# Patient Record
Sex: Female | Born: 1970 | Race: White | Hispanic: No | Marital: Married | Smoking: Never smoker
Health system: Southern US, Community
[De-identification: ages and names within clinical notes are randomized; demographics above are authoritative.]

---

## 2020-07-31 ENCOUNTER — Emergency Department (INDEPENDENT_AMBULATORY_CARE_PROVIDER_SITE_OTHER)
Admission: EM | Admit: 2020-07-31 | Discharge: 2020-07-31 | Disposition: A | Discharge status: Home / Self Care | Payer: 59 | Source: Home / Self Care

## 2020-07-31 ENCOUNTER — Emergency Department (INDEPENDENT_AMBULATORY_CARE_PROVIDER_SITE_OTHER): Payer: 59

## 2020-07-31 ENCOUNTER — Other Ambulatory Visit: Payer: Self-pay

## 2020-07-31 ENCOUNTER — Encounter: Payer: Self-pay | Admitting: Emergency Medicine

## 2020-07-31 ENCOUNTER — Emergency Department: Admit: 2020-07-31 | Discharge status: Absent without leave | Payer: Self-pay

## 2020-07-31 DIAGNOSIS — M25552 Pain in left hip: Secondary | ICD-10-CM | POA: Diagnosis not present

## 2020-07-31 DIAGNOSIS — M25559 Pain in unspecified hip: Secondary | ICD-10-CM | POA: Diagnosis not present

## 2020-07-31 MED ORDER — PREDNISONE 20 MG PO TABS: 40.0000 mg | ORAL_TABLET | Freq: Every day | ORAL | 0 refills | Status: DC

## 2020-07-31 MED ORDER — TIZANIDINE HCL 4 MG PO TABS: 4.0000 mg | ORAL_TABLET | Freq: Every day | ORAL | 0 refills | Status: DC

## 2020-07-31 NOTE — ED Triage Notes (Signed)
Lt hip pain from groin to hip, denies injury x 4 months. Unvaccinated

## 2020-07-31 NOTE — ED Provider Notes (Signed)
[facility]    CSN: [id number] Arrival date & time: 07/31/20  [id number]      History   Chief Complaint Chief Complaint  Patient presents with  . Hip Pain    HPI Jessica Mann is a 50 y.o. female.   HPI Patient in today for evaluation of left hip pain x 4 months. No injury. Pain radiates into the groin region. Attempted relief OTC analgesics without relief. 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 She feels pain is worsening. History reviewed. No pertinent past medical history.  There are no problems to display for this patient.   History reviewed. No pertinent surgical history.  OB History   No obstetric history on file.      Home Medications    Prior to Admission medications   Not on File    Family History Family History  Problem Relation Age of Onset  . Lupus Mother   . Cancer Father     Social History Social History   Tobacco Use  . Smoking status: Never Smoker  . Smokeless tobacco: Never Used  Vaping Use  . Vaping Use: Never used  Substance Use Topics  . Alcohol use: Yes     Allergies   Patient has no known allergies.   Review of Systems Review of Systems Pertinent negatives listed in HPI  Physical Exam Triage Vital Signs ED Triage Vitals  Enc Vitals Group     BP 07/31/20 1032 134/90     Pulse Rate 07/31/20 1032 88     Resp 07/31/20 1032 18     Temp 07/31/20 1032 97.9 F (36.6 C)     Temp Source 07/31/20 1032 Oral     SpO2 07/31/20 1032 95 %     Weight 07/31/20 1034 195 lb (88.5 kg)     Height 07/31/20 1034 5\' 2"  (1.575 m)     Head Circumference --      Peak Flow --      Pain Score 07/31/20 1033 8     Pain Loc --      Pain Edu? --      Excl. in [place]? --    No data found.  Updated Vital Signs BP 134/90 (BP Location: Right Arm)   Pulse 88   Temp 97.9 F (36.6 C) (Oral)   Resp 18   Ht 5\' 2"  (1.575 m)   Wt 195 lb (88.5 kg)   LMP 07/19/2020   SpO2 95%   BMI 35.67  kg/m   Visual Acuity Right Eye Distance:   Left Eye Distance:   Bilateral Distance:    Right Eye Near:   Left Eye Near:    Bilateral Near:     Physical Exam   UC Treatments / Results  Labs (all labs ordered are listed, but only abnormal results are displayed) Labs Reviewed - No data to display  EKG   Radiology DG HIP UNILAT WITH PELVIS 2-3 VIEWS LEFT  Result Date: 07/31/2020 CLINICAL DATA:  Four months of hip pain EXAM: DG HIP (WITH OR WITHOUT PELVIS) 2-3V LEFT COMPARISON:  None. FINDINGS: There is no evidence of hip fracture or dislocation. There is no evidence of arthropathy or other focal bone abnormality. Probable phleboliths within the pelvis. IMPRESSION: No fracture or dislocation of the left hip. The joint spaces are well preserved. Electronically Signed   By: Eddie Candle M.D.   On: 07/31/2020 11:18   Procedures Procedures (including critical  care time)  Medications Ordered in UC Medications - No data to display  Initial Impression / Assessment and Plan / UC Course  I have reviewed the triage vital signs and the nursing notes.  Pertinent labs & imaging results that were available during my care of the patient were reviewed by me and considered in my medical decision making (see chart for details).     Left hip pain, imaging negative of any acute or chronic changes which would explain symptoms. Trial short of prednisone and muscle relaxer for at bedtime for pain. Sports medicine follow-up if symptoms worsen or do no improve  Final Clinical Impressions(s) / UC Diagnoses   Final diagnoses:  Hip pain     Discharge Instructions     Follow-up with sports medicine provider for ongoing work-up and management of hip pain.  Continue application of heat. You also can take extra strength tylenol with prednisone for pain relief and hold Tizanidine for bedtime hip pain relief as medication can cause drowsiness.    ED Prescriptions    None     PDMP not reviewed  this encounter.   Bing Neighbors, FNP 08/06/20 [phone]

## 2020-07-31 NOTE — Discharge Instructions (Addendum)
Follow-up with sports medicine provider for ongoing work-up and management of hip pain.  Continue application of heat. You also can take extra strength tylenol with prednisone for pain relief and hold Tizanidine for bedtime hip pain relief as medication can cause drowsiness.

## 2020-10-31 ENCOUNTER — Emergency Department (HOSPITAL_BASED_OUTPATIENT_CLINIC_OR_DEPARTMENT_OTHER)
Admission: RE | Admit: 2020-10-31 | Discharge: 2020-10-31 | Disposition: A | Discharge status: Home / Self Care | Payer: 59 | Source: Ambulatory Visit | Attending: Emergency Medicine | Admitting: Emergency Medicine

## 2020-10-31 ENCOUNTER — Other Ambulatory Visit: Payer: Self-pay

## 2020-10-31 ENCOUNTER — Emergency Department (HOSPITAL_BASED_OUTPATIENT_CLINIC_OR_DEPARTMENT_OTHER)
Admission: EM | Admit: 2020-10-31 | Discharge: 2020-10-31 | Disposition: A | Discharge status: Home / Self Care | Payer: 59 | Attending: Emergency Medicine | Admitting: Emergency Medicine

## 2020-10-31 ENCOUNTER — Encounter (HOSPITAL_BASED_OUTPATIENT_CLINIC_OR_DEPARTMENT_OTHER): Payer: Self-pay | Admitting: Emergency Medicine

## 2020-10-31 DIAGNOSIS — R1011 Right upper quadrant pain: Secondary | ICD-10-CM | POA: Insufficient documentation

## 2020-10-31 DIAGNOSIS — R1013 Epigastric pain: Secondary | ICD-10-CM | POA: Diagnosis present

## 2020-10-31 DIAGNOSIS — K760 Fatty (change of) liver, not elsewhere classified: Secondary | ICD-10-CM | POA: Insufficient documentation

## 2020-10-31 DIAGNOSIS — R7401 Elevation of levels of liver transaminase levels: Secondary | ICD-10-CM

## 2020-10-31 DIAGNOSIS — R1010 Upper abdominal pain, unspecified: Secondary | ICD-10-CM

## 2020-10-31 LAB — TROPONIN I (HIGH SENSITIVITY): Troponin I (High Sensitivity): 3 ng/L (ref <18)

## 2020-10-31 LAB — URINALYSIS, ROUTINE W REFLEX MICROSCOPIC
Bilirubin Urine: NEGATIVE
Glucose, UA: NEGATIVE mg/dL
Hgb urine dipstick: NEGATIVE
Ketones, ur: NEGATIVE mg/dL
Nitrite: NEGATIVE
Protein, ur: NEGATIVE mg/dL
Specific Gravity, Urine: 1.02 (ref 1.005–1.030)
pH: 7 (ref 5.0–8.0)

## 2020-10-31 LAB — COMPREHENSIVE METABOLIC PANEL
ALT: 81 U/L — ABNORMAL HIGH (ref 0–44)
AST: 108 U/L — ABNORMAL HIGH (ref 15–41)
Albumin: 3.8 g/dL (ref 3.5–5.0)
Alkaline Phosphatase: 90 U/L (ref 38–126)
Anion gap: 10 (ref 5–15)
BUN: 15 mg/dL (ref 6–20)
CO2: 26 mmol/L (ref 22–32)
Calcium: 8.9 mg/dL (ref 8.9–10.3)
Chloride: 101 mmol/L (ref 98–111)
Creatinine, Ser: 0.63 mg/dL (ref 0.44–1.00)
GFR, Estimated: >60 mL/min (ref >60)
Glucose, Bld: 122 mg/dL — ABNORMAL HIGH (ref 70–99)
Potassium: 3.7 mmol/L (ref 3.5–5.1)
Sodium: 137 mmol/L (ref 135–145)
Total Bilirubin: 0.7 mg/dL (ref 0.3–1.2)
Total Protein: 6.8 g/dL (ref 6.5–8.1)

## 2020-10-31 LAB — CBC
HCT: 38.3 % (ref 36.0–46.0)
Hemoglobin: 13.1 g/dL (ref 12.0–15.0)
MCH: 28.6 pg (ref 26.0–34.0)
MCHC: 34.2 g/dL (ref 30.0–36.0)
MCV: 83.6 fL (ref 80.0–100.0)
Platelets: 171 10*3/uL (ref 150–400)
RBC: 4.58 MIL/uL (ref 3.87–5.11)
RDW: 12.6 % (ref 11.5–15.5)
WBC: 8.5 10*3/uL (ref 4.0–10.5)
nRBC: 0 % (ref 0.0–0.2)

## 2020-10-31 LAB — URINALYSIS, MICROSCOPIC (REFLEX): WBC, UA: >50 WBC/hpf (ref 0–5)

## 2020-10-31 LAB — LIPASE, BLOOD: Lipase: 48 U/L (ref 11–51)

## 2020-10-31 LAB — PREGNANCY, URINE: Preg Test, Ur: NEGATIVE

## 2020-10-31 MED ORDER — ONDANSETRON HCL 4 MG/2ML IJ SOLN
4.0000 mg | Freq: Once | INTRAMUSCULAR | Status: AC
  Administered 2020-10-31: 4 mg via INTRAVENOUS
  Filled 2020-10-31: qty 2

## 2020-10-31 MED ORDER — MORPHINE SULFATE (PF) 4 MG/ML IV SOLN
4.0000 mg | Freq: Once | INTRAVENOUS | Status: AC
  Administered 2020-10-31: 4 mg via INTRAVENOUS
  Filled 2020-10-31: qty 1

## 2020-10-31 MED ORDER — LACTATED RINGERS IV BOLUS
1000.0000 mL | Freq: Once | INTRAVENOUS | Status: AC
  Administered 2020-10-31: 1000 mL via INTRAVENOUS

## 2020-10-31 MED ORDER — PROCHLORPERAZINE EDISYLATE 10 MG/2ML IJ SOLN
10.0000 mg | Freq: Once | INTRAMUSCULAR | Status: AC
  Administered 2020-10-31: 10 mg via INTRAVENOUS
  Filled 2020-10-31: qty 2

## 2020-10-31 NOTE — ED Provider Notes (Signed)
[facility]PART[facility] Provider Note   CSN: [id number] Arrival date & time: 10/31/20  0008     History Chief complaint: Abdominal pain  Jessica Mann is a 50 y.o. female.  The history is provided by the patient.  She has no significant past history and comes in having been awakened by epigastric pain which radiated around to the back and into the chest.  She describes a burning pain.  There is associated nausea and vomiting.  Pain was initially rated at 8/7, and has subsided to 6/7.  Nothing seems to make it better, nothing makes it worse.  It did not improve after emesis.  She denies constipation or diarrhea.  She had eaten a dinner of spaghetti and meatballs before going to bed.  History reviewed. No pertinent past medical history.  There are no problems to display for this patient.   History reviewed. No pertinent surgical history.   OB History   No obstetric history on file.     Family History  Problem Relation Age of Onset  . Lupus Mother   . Cancer Father     Social History   Tobacco Use  . Smoking status: Never Smoker  . Smokeless tobacco: Never Used  Vaping Use  . Vaping Use: Never used  Substance Use Topics  . Alcohol use: Yes  . Drug use: Never    Home Medications Prior to Admission medications   Medication Sig Start Date End Date Taking? Authorizing Provider  predniSONE (DELTASONE) 20 MG tablet Take 2 tablets (40 mg total) by mouth daily with breakfast. 07/31/20   Scot Jun, FNP  tiZANidine (ZANAFLEX) 4 MG tablet Take 1 tablet (4 mg total) by mouth at bedtime. 07/31/20   Scot Jun, FNP    Allergies    Patient has no known allergies.  Review of Systems   Review of Systems  All other systems reviewed and are negative.   Physical Exam Updated Vital Signs BP (!) 166/94 (BP Location: Right Arm)   Pulse 77   Temp 97.7 F (36.5 C) (Oral)   Resp 16   Ht 5\' 2"  (1.575 m)   Wt 86 kg   LMP 10/20/2020   SpO2 100%    BMI 34.70 kg/m   Physical Exam Vitals and nursing note reviewed.   50 year old female, resting comfortably and in no acute distress. Vital signs are significant for elevated blood pressure. Oxygen saturation is 100%, which is normal. Head is normocephalic and atraumatic. PERRLA, EOMI. Oropharynx is clear. Neck is nontender and supple without adenopathy or JVD. Back is nontender and there is no CVA tenderness. Lungs are clear without rales, wheezes, or rhonchi. Chest is nontender. Heart has regular rate and rhythm without murmur. Abdomen is soft, flat, with moderate right upper quadrant tenderness with a positive Murphy sign.  No other abdominal tenderness elicited.  There are no masses or hepatosplenomegaly and peristalsis is hypoactive. Extremities have no cyanosis or edema, full range of motion is present. Skin is warm and dry without rash. Neurologic: Mental status is normal, cranial nerves are intact, there are no motor or sensory deficits.  ED Results / Procedures / Treatments   Labs (all labs ordered are listed, but only abnormal results are displayed) Labs Reviewed  COMPREHENSIVE METABOLIC PANEL - Abnormal; Notable for the following components:      Result Value   Glucose, Bld 122 (*)    AST 108 (*)    ALT 81 (*)  All other components within normal limits  URINALYSIS, ROUTINE W REFLEX MICROSCOPIC - Abnormal; Notable for the following components:   APPearance HAZY (*)    Leukocytes,Ua LARGE (*)    All other components within normal limits  URINALYSIS, MICROSCOPIC (REFLEX) - Abnormal; Notable for the following components:   Bacteria, UA MANY (*)    All other components within normal limits  LIPASE, BLOOD  CBC  PREGNANCY, URINE  TROPONIN I (HIGH SENSITIVITY)    EKG EKG Interpretation  Date/Time:  Wednesday October 31 2020 00:24:12 EDT Ventricular Rate:  71 PR Interval:  141 QRS Duration: 88 QT Interval:  398 QTC Calculation: 433 R Axis:   64 Text  Interpretation: Sinus rhythm Normal ECG No significant change was found Confirmed by Delora Fuel ([id number]) on 10/31/2020 12:30:55 AM  Procedures Procedures   Medications Ordered in ED Medications  lactated ringers bolus 1,000 mL (has no administration in time range)  ondansetron (ZOFRAN) injection 4 mg (has no administration in time range)  morphine 4 MG/ML injection 4 mg (has no administration in time range)    ED Course  I have reviewed the triage vital signs and the nursing notes.  Pertinent lab results that were available during my care of the patient were reviewed by me and considered in my medical decision making (see chart for details).  MDM Rules/Calculators/A&P Epigastric pain concerning for biliary colic.  Consider peptic ulcer disease, pancreatitis, cardiac disease, diverticulitis.  Differential does include conditions with significant morbidity and mortality.  Old records are reviewed, and she has no relevant past visits.  No prior abdominal imaging studies.  Screening labs are obtained and she will be given IV fluids, morphine, ondansetron.    She feels much better following above-noted treatment.  ECG is normal.  Labs do show mild elevation of transaminases which would be consistent with biliary tract disease.  Lipase is normal and WBC is normal.  Urinalysis has bacteria and pyuria, but patient has no symptoms of UTI and does not need to be on antibiotics.  She will be discharged with arrangements made for outpatient right upper quadrant ultrasound.  Final Clinical Impression(s) / ED Diagnoses Final diagnoses:  Upper abdominal pain  Elevated transaminase level    Rx / DC Orders ED Discharge Orders         Ordered    US Abdomen Limited RUQ/Gall Bladder        10/31/20 8891           Delora Fuel, MD 69/45/03 0151

## 2020-10-31 NOTE — ED Triage Notes (Signed)
Pt state upper abdomen and chest pain "feels like heart burn" started 1 hour ago

## 2020-10-31 NOTE — Discharge Instructions (Addendum)
I am suspicious that gallstones are what caused your pain tonight.  Please return in the morning for an ultrasound to see if you do indeed have gallstones.  If gallstones are present, you will need to make an appointment with the general surgeon.  If gallstones are not present, you will need to follow-up with your primary care provider for further evaluation.

## 2020-11-15 ENCOUNTER — Other Ambulatory Visit: Payer: Self-pay

## 2020-11-15 ENCOUNTER — Encounter: Payer: Self-pay | Admitting: Family Medicine

## 2020-11-15 ENCOUNTER — Ambulatory Visit (INDEPENDENT_AMBULATORY_CARE_PROVIDER_SITE_OTHER): Payer: 59 | Admitting: Family Medicine

## 2020-11-15 VITALS — BP 122/80 | HR 86 | Temp 98.3°F | Ht 61.5 in | Wt 185.4 lb

## 2020-11-15 DIAGNOSIS — Z131 Encounter for screening for diabetes mellitus: Secondary | ICD-10-CM | POA: Diagnosis not present

## 2020-11-15 DIAGNOSIS — Z1159 Encounter for screening for other viral diseases: Secondary | ICD-10-CM

## 2020-11-15 DIAGNOSIS — Z0000 Encounter for general adult medical examination without abnormal findings: Secondary | ICD-10-CM | POA: Diagnosis not present

## 2020-11-15 DIAGNOSIS — R7989 Other specified abnormal findings of blood chemistry: Secondary | ICD-10-CM | POA: Diagnosis not present

## 2020-11-15 DIAGNOSIS — D367 Benign neoplasm of other specified sites: Secondary | ICD-10-CM

## 2020-11-15 DIAGNOSIS — Z1321 Encounter for screening for nutritional disorder: Secondary | ICD-10-CM

## 2020-11-15 DIAGNOSIS — Z114 Encounter for screening for human immunodeficiency virus [HIV]: Secondary | ICD-10-CM

## 2020-11-15 DIAGNOSIS — Z13220 Encounter for screening for lipoid disorders: Secondary | ICD-10-CM

## 2020-11-15 DIAGNOSIS — Z1211 Encounter for screening for malignant neoplasm of colon: Secondary | ICD-10-CM

## 2020-11-15 DIAGNOSIS — K760 Fatty (change of) liver, not elsewhere classified: Secondary | ICD-10-CM

## 2020-11-15 NOTE — Addendum Note (Signed)
Addended by: Lynnea Ferrier on: 11/15/2020 02:20 PM   Modules accepted: Orders

## 2020-11-15 NOTE — Progress Notes (Signed)
Jessica Mann is a 50 y.o. female  Chief Complaint  Patient presents with  . Establish Care    Np here for CPE, she is fasting today.Pt would a referral for a cyst on rt arm.  Pt is UTD w/pap, 06/2020, pt is not UTD w/colonoscopy, unsure od Tdap vaccine.    HPI: Jessica Mann is a 50 y.o. female seen today to establish care and for annual CPE, labs. She is fasting today.   Last PAP: 07/2020 - normal PAP, HPV negative. Waldon Reining, PA with [phone]. She has f/u appt for [place] and ablation. Last mammo: needs to schedule Last colonoscopy: due - last in 1998 (h/o parasitic infection) She is unsure of last Tdap but does not think it has been 10 years since last one.   Diet/Exercise: weight loss program [employer]) - lost 20lbs since 07/2020; no regular CV exercise but would like to encorporate Dental: UTD Vision: UTD  Pt requests referral to have cyst on Rt arm excised/removed. Present x 5 years, no change in size.   She was seen in ER 2 wks ago for upper abdominal pain. Normal UA, CBC, lipase, renal function. AST, ALT were elevated, normal bili. Abd US showed hepatic steatosis.    History reviewed. No pertinent past medical history.  History reviewed. No pertinent surgical history.  Social History   Socioeconomic History  . Marital status: Married    Spouse name: Not on file  . Number of children: Not on file  . Years of education: Not on file  . Highest education level: Not on file  Occupational History  . Not on file  Tobacco Use  . Smoking status: Never Smoker  . Smokeless tobacco: Never Used  Vaping Use  . Vaping Use: Never used  Substance and Sexual Activity  . Alcohol use: Yes  . Drug use: Never  . Sexual activity: Not on file  Other Topics Concern  . Not on file  Social History Narrative  . Not on file   Social Determinants of Health   Financial Resource Strain: Not on file  Food Insecurity: Not on file  Transportation Needs: Not on file  Physical Activity:  Not on file  Stress: Not on file  Social Connections: Not on file  Intimate Partner Violence: Not on file    Family History  Problem Relation Age of Onset  . Lupus Mother   . Cancer Father       There is no immunization history on file for this patient.  No outpatient encounter medications on file as of 11/15/2020.   No facility-administered encounter medications on file as of 11/15/2020.     ROS: Gen: no fever, chills  Skin: no rash, itching ENT: no ear pain, ear drainage, nasal congestion, rhinorrhea, sinus pressure, sore throat Eyes: no blurry vision, double vision Resp: no cough, wheeze,SOB CV: no CP, palpitations, LE edema,  GI: no heartburn, n/v/d/c, + upper abd pain GU: no dysuria, urgency, frequency, hematuria MSK: Lt hip pain Neuro: no dizziness, headache, weakness, vertigo Psych: no depression, anxiety, insomnia   No Known Allergies  BP 122/80 (BP Location: Right Arm, Patient Position: Sitting, Cuff Size: Normal)   Pulse 86   Temp 98.3 F (36.8 C) (Oral)   Ht 5' 1.5" (1.562 m)   Wt 185 lb 6.4 oz (84.1 kg)   LMP 11/15/2020   SpO2 97%   BMI 34.46 kg/m   Physical Exam Constitutional:      General: She is not in acute distress.  Appearance: She is well-developed.  HENT:     Head: Normocephalic and atraumatic.     Right Ear: Tympanic membrane and ear canal normal.     Left Ear: Tympanic membrane and ear canal normal.     Nose: Nose normal.     Mouth/Throat:     Mouth: Mucous membranes are moist.     Pharynx: Oropharynx is clear.  Eyes:     Conjunctiva/sclera: Conjunctivae normal.  Neck:     Thyroid: No thyromegaly.  Cardiovascular:     Rate and Rhythm: Normal rate and regular rhythm.     Heart sounds: Normal heart sounds. No murmur heard.   Pulmonary:     Effort: Pulmonary effort is normal. No respiratory distress.     Breath sounds: Normal breath sounds. No wheezing or rhonchi.  Abdominal:     General: Bowel sounds are normal. There is no  distension.     Palpations: Abdomen is soft. There is no mass.     Tenderness: There is no abdominal tenderness.  Musculoskeletal:     Cervical back: Neck supple.     Right lower leg: No edema.     Left lower leg: No edema.  Lymphadenopathy:     Cervical: No cervical adenopathy.  Skin:    General: Skin is warm and dry.  Neurological:     Mental Status: She is alert and oriented to person, place, and time.     Motor: No abnormal muscle tone.     Coordination: Coordination normal.  Psychiatric:        Behavior: Behavior normal.     CLINICAL DATA:  Right upper quadrant abdominal pain, nausea/vomiting  EXAM: ULTRASOUND ABDOMEN LIMITED RIGHT UPPER QUADRANT  FINDINGS: Gallbladder: No gallstones, gallbladder wall thickening, or pericholecystic fluid. Negative sonographic Murphy's sign.  Common bile duct: Diameter: 5 mm  Liver: Hepatic steatosis with focal fatty sparing, including along the gallbladder fossa. Portal vein is patent on color Doppler imaging with normal direction of blood flow towards the liver.  IMPRESSION: Hepatic steatosis with focal fatty sparing.   Lab Results  Component Value Date   WBC 8.5 10/31/2020   HGB 13.1 10/31/2020   HCT 38.3 10/31/2020   MCV 83.6 10/31/2020   PLT 171 10/31/2020   Lab Results  Component Value Date   NA 137 10/31/2020   K 3.7 10/31/2020   CREATININE 0.63 10/31/2020   GFRNONAA >60 10/31/2020   GLUCOSE 122 (H) 10/31/2020   Lab Results  Component Value Date   LIPASE 48 10/31/2020   Lab Results  Component Value Date   ALT 81 (H) 10/31/2020   AST 108 (H) 10/31/2020   ALKPHOS 90 10/31/2020   BILITOT 0.7 10/31/2020     A/P:  1. Annual physical exam - discussed importance of regular CV exercise, healthy diet, adequate sleep - UTD on PAP - pt to schedule mammo - colonoscopy referral placed today - UTD on dental and vision exams - labs today, other labs done about 2 wks ago (reviewed with pt today) - next  CPE in 1 year  2. Screening for diabetes mellitus (DM) - Glucose, fasting  3. Screening for lipid disorders - Lipid panel  4. Encounter for vitamin deficiency screening - VITAMIN D 25 Hydroxy (Vit-D Deficiency, Fractures)  5. Hepatic steatosis 6. Elevated LFTs - hepatic steatosis confirmed on [place] - mildly elevated LFTs - pt has lost 20lbs in 4 mo and continuing to work to lose weight and improve diet - recheck hepatic function panel  in 4-74mo  7. Dermoid cyst of arm, right - Ambulatory referral to Dermatology  8. Screening for colon cancer - Ambulatory referral to Gastroenterology  9. Encounter for hepatitis C screening test for low risk patient - Hepatitis C Antibody  10. Screening for HIV without presence of risk factors - HIV Antibody (routine testing w rflx)    This visit occurred during the SARS-CoV-2 public health emergency.  Safety protocols were in place, including screening questions prior to the visit, additional usage of staff PPE, and extensive cleaning of exam room while observing appropriate contact time as indicated for disinfecting solutions.

## 2020-11-16 LAB — LIPID PANEL
Cholesterol: 213 mg/dL — ABNORMAL HIGH (ref 0–200)
HDL: 34 mg/dL — ABNORMAL LOW (ref >39.00)
LDL Cholesterol: 142 mg/dL — ABNORMAL HIGH (ref 0–99)
NonHDL: 179.32
Total CHOL/HDL Ratio: 6
Triglycerides: 186 mg/dL — ABNORMAL HIGH (ref 0.0–149.0)
VLDL: 37.2 mg/dL (ref 0.0–40.0)

## 2020-11-16 LAB — HEPATITIS C ANTIBODY
Hepatitis C Ab: NONREACTIVE
SIGNAL TO CUT-OFF: 0.01 (ref <1.00)

## 2020-11-16 LAB — GLUCOSE, FASTING: Glucose, Bld: 91 mg/dL (ref 65–99)

## 2020-11-16 LAB — VITAMIN D 25 HYDROXY (VIT D DEFICIENCY, FRACTURES): VITD: 26.26 ng/mL — ABNORMAL LOW (ref 30.00–100.00)

## 2020-11-16 LAB — HIV ANTIBODY (ROUTINE TESTING W REFLEX): HIV 1&2 Ab, 4th Generation: NONREACTIVE

## 2020-12-13 ENCOUNTER — Encounter: Payer: Self-pay | Admitting: Family Medicine

## 2022-01-19 IMAGING — US US ABDOMEN LIMITED RUQ/ASCITES
1 series · 14 of 25 positions shown · non-contrast
Comparison: None.

CLINICAL DATA: Right upper quadrant abdominal pain, nausea/vomiting

EXAM:
ULTRASOUND ABDOMEN LIMITED RIGHT UPPER QUADRANT

[Series 1: us abdomen limited ruq/ascites · 14 of 43 slices shown]
[im 1/43]
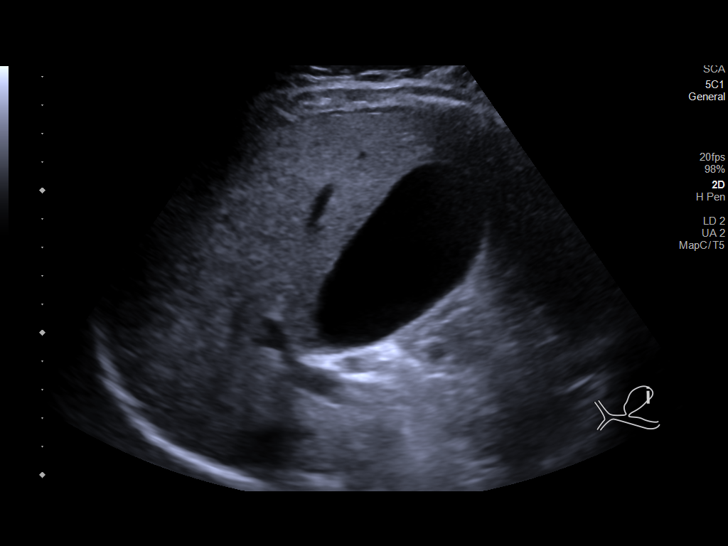
[im 4/43]
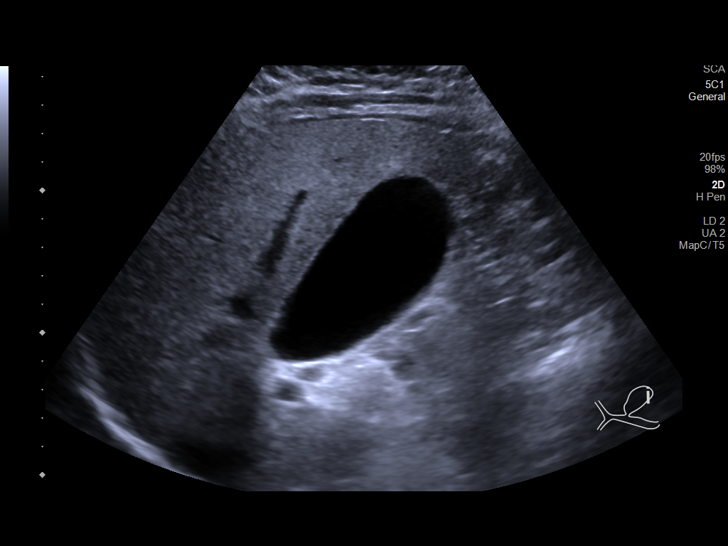
[im 8/43]
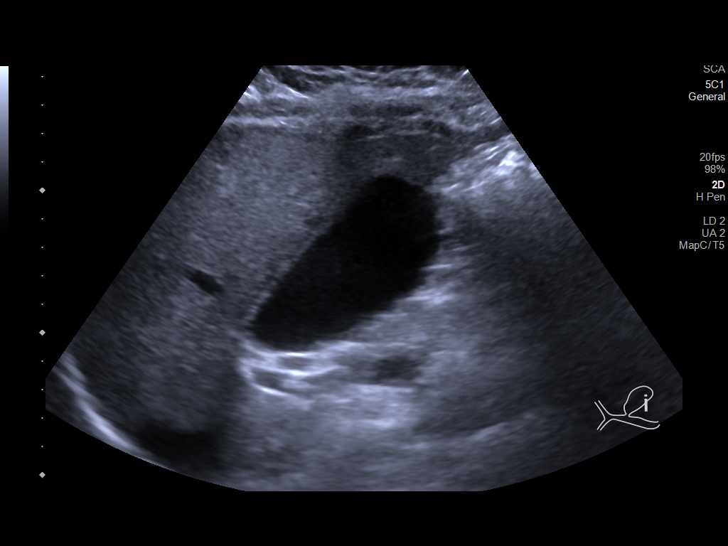
[im 11/43]
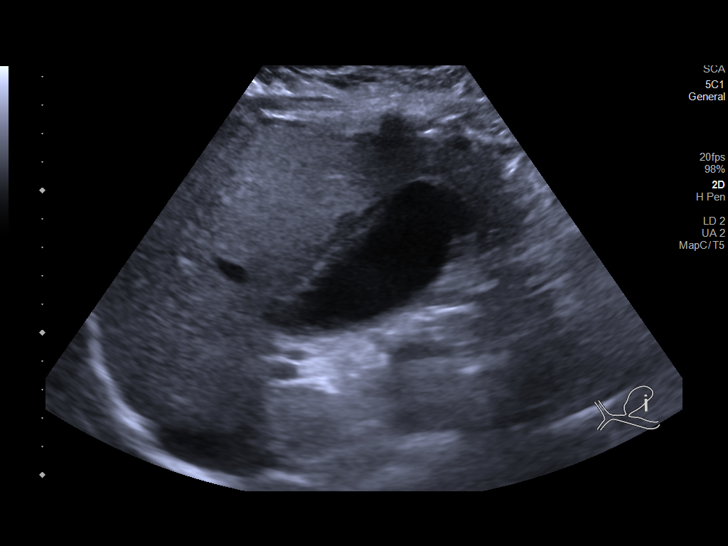
[im 15/43]
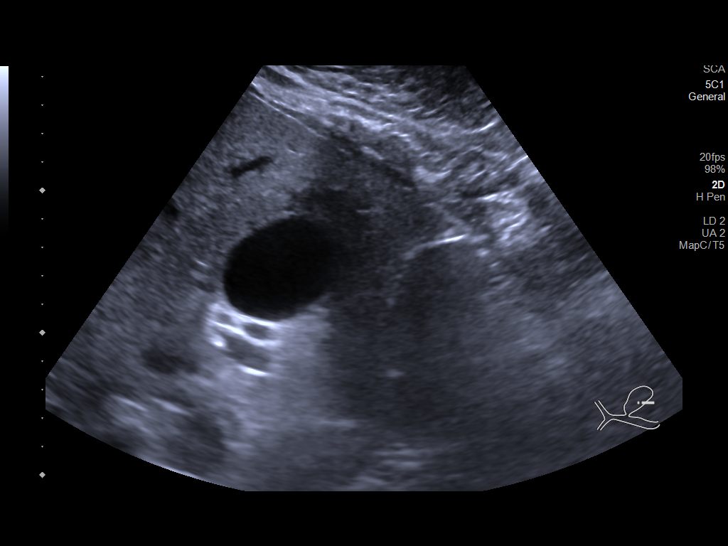
[im 16/43]
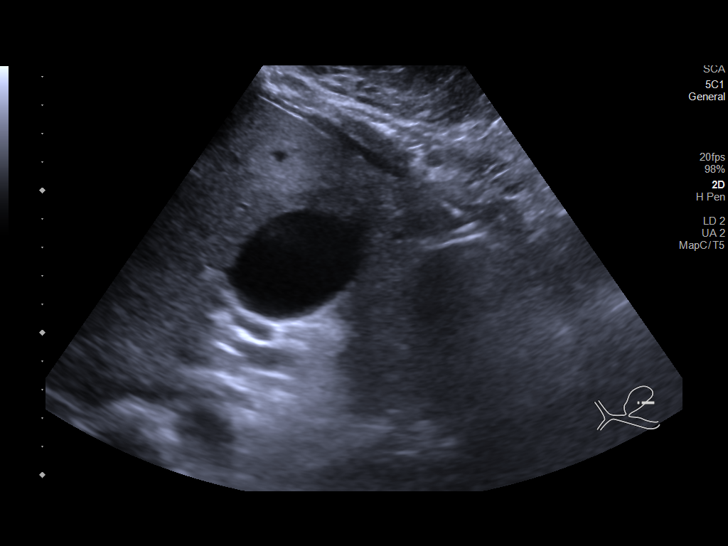
[im 20/43]
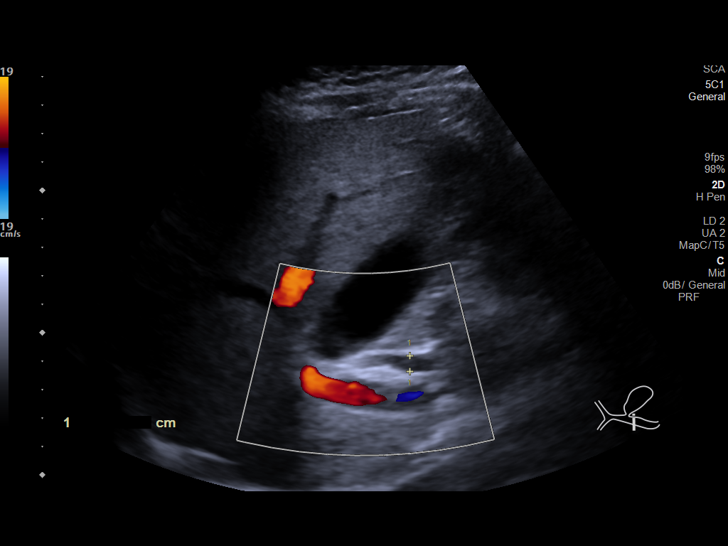
[im 23/43]
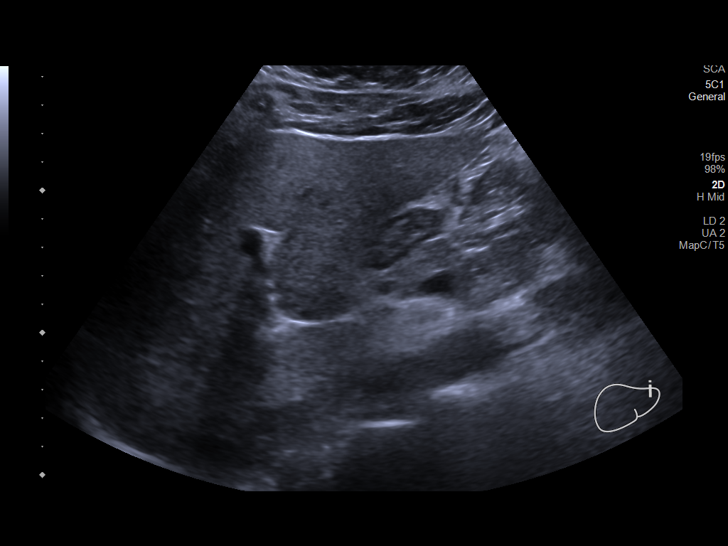
[im 27/43]
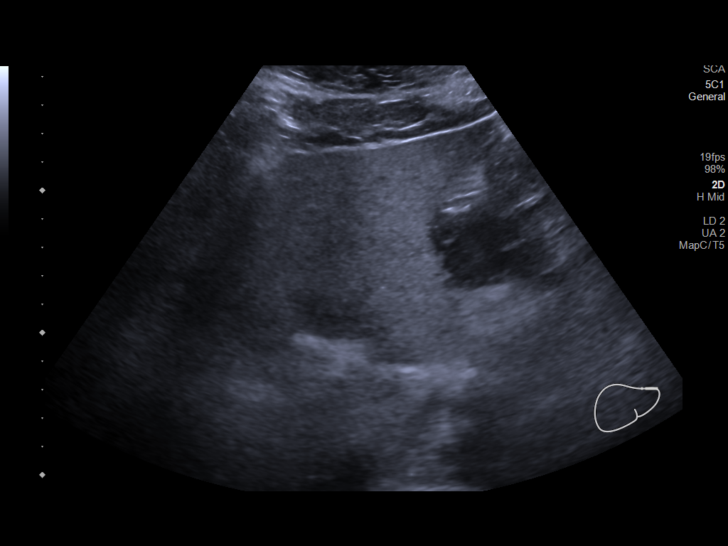
[im 29/43]
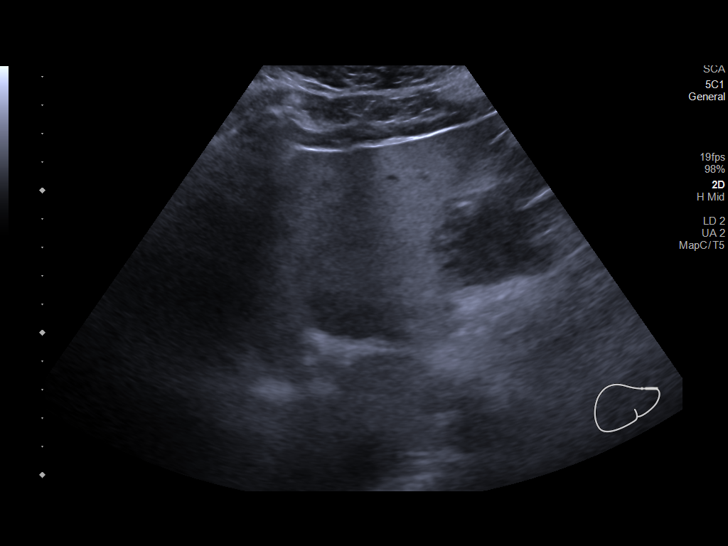
[im 32/43]
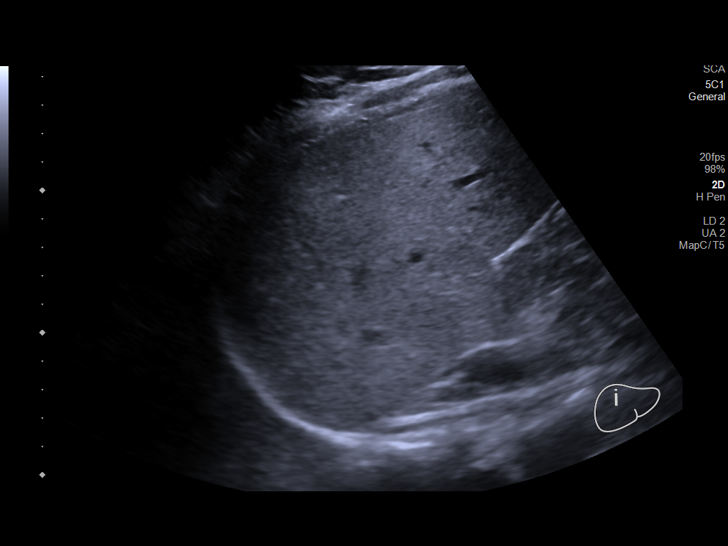
[im 36/43]
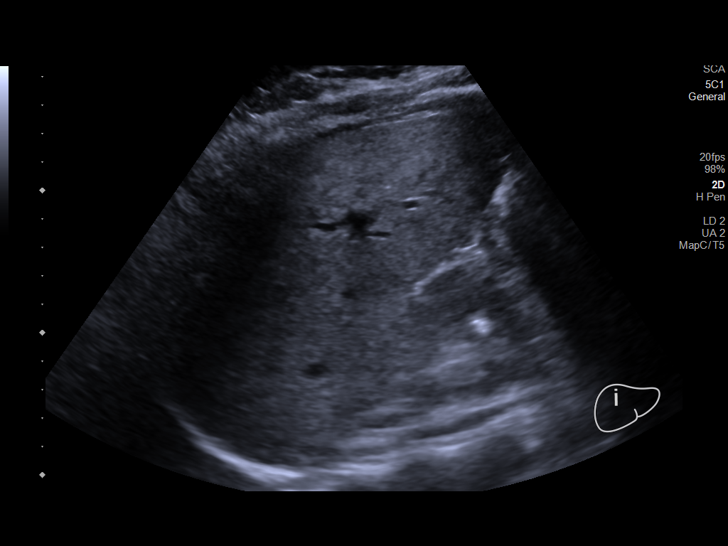
[im 39/43]
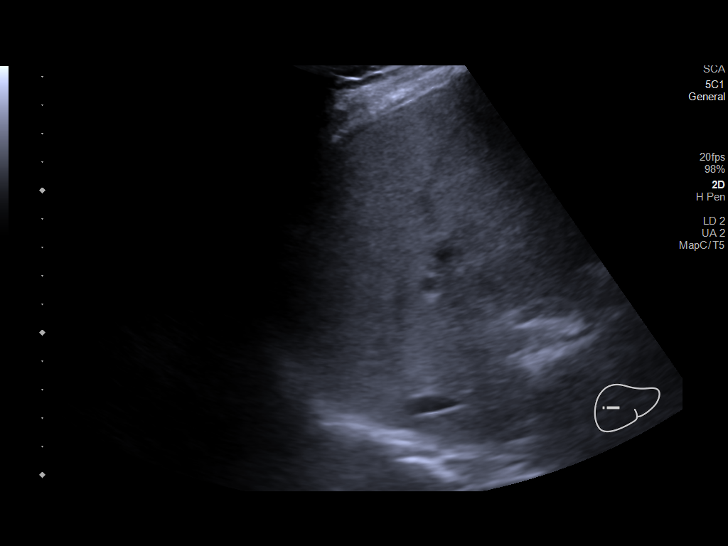
[im 43/43]
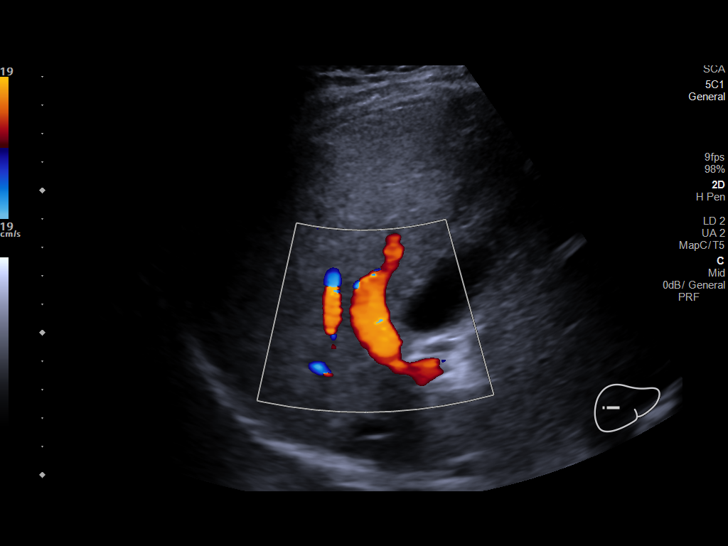

[14 of 25 positions shown; findings below may reference images not displayed]

FINDINGS: Gallbladder:

No gallstones, gallbladder wall thickening, or pericholecystic
fluid. Negative sonographic Murphy's sign.

Common bile duct:

Diameter: 5 mm

Liver:

Hepatic steatosis with focal fatty sparing, including along the
gallbladder fossa. Portal vein is patent on color Doppler imaging
with normal direction of blood flow towards the liver.

Other: None.
IMPRESSION: Hepatic steatosis with focal fatty sparing.
